# Patient Record
Sex: Male | Born: 1985 | Race: Black or African American | Hispanic: No | Marital: Single | Smoking: Current every day smoker
Health system: Southern US, Community
[De-identification: ages and names within clinical notes are randomized; demographics above are authoritative.]

---

## 2017-09-15 ENCOUNTER — Encounter (HOSPITAL_COMMUNITY): Payer: Self-pay

## 2017-09-15 ENCOUNTER — Emergency Department (HOSPITAL_COMMUNITY): Payer: Self-pay

## 2017-09-15 ENCOUNTER — Emergency Department (HOSPITAL_COMMUNITY)
Admission: EM | Admit: 2017-09-15 | Discharge: 2017-09-15 | Disposition: A | Discharge status: Home / Self Care | Payer: Self-pay | Attending: Emergency Medicine | Admitting: Emergency Medicine

## 2017-09-15 DIAGNOSIS — J069 Acute upper respiratory infection, unspecified: Secondary | ICD-10-CM | POA: Insufficient documentation

## 2017-09-15 DIAGNOSIS — R55 Syncope and collapse: Secondary | ICD-10-CM | POA: Insufficient documentation

## 2017-09-15 DIAGNOSIS — F17210 Nicotine dependence, cigarettes, uncomplicated: Secondary | ICD-10-CM | POA: Insufficient documentation

## 2017-09-15 DIAGNOSIS — E860 Dehydration: Secondary | ICD-10-CM | POA: Insufficient documentation

## 2017-09-15 LAB — CBC
HCT: 42.1 % (ref 39.0–52.0)
Hemoglobin: 13.6 g/dL (ref 13.0–17.0)
MCH: 28 pg (ref 26.0–34.0)
MCHC: 32.3 g/dL (ref 30.0–36.0)
MCV: 86.6 fL (ref 78.0–100.0)
PLATELETS: 267 10*3/uL (ref 150–400)
RBC: 4.86 MIL/uL (ref 4.22–5.81)
RDW: 13.2 % (ref 11.5–15.5)
WBC: 5.1 10*3/uL (ref 4.0–10.5)

## 2017-09-15 LAB — URINALYSIS, ROUTINE W REFLEX MICROSCOPIC
Bilirubin Urine: NEGATIVE
Glucose, UA: NEGATIVE mg/dL
HGB URINE DIPSTICK: NEGATIVE
Ketones, ur: 20 mg/dL — AB
LEUKOCYTES UA: NEGATIVE
NITRITE: NEGATIVE
PH: 5 (ref 5.0–8.0)
Protein, ur: 30 mg/dL — AB
SPECIFIC GRAVITY, URINE: 1.025 (ref 1.005–1.030)

## 2017-09-15 LAB — COMPREHENSIVE METABOLIC PANEL
ALT: 21 U/L (ref 17–63)
AST: 26 U/L (ref 15–41)
Albumin: 4 g/dL (ref 3.5–5.0)
Alkaline Phosphatase: 64 U/L (ref 38–126)
Anion gap: 12 (ref 5–15)
BILIRUBIN TOTAL: 1.3 mg/dL — AB (ref 0.3–1.2)
BUN: 17 mg/dL (ref 6–20)
CALCIUM: 9.1 mg/dL (ref 8.9–10.3)
CO2: 23 mmol/L (ref 22–32)
CREATININE: 1.44 mg/dL — AB (ref 0.61–1.24)
Chloride: 102 mmol/L (ref 101–111)
GFR calc Af Amer: >60 mL/min (ref >60)
Glucose, Bld: 133 mg/dL — ABNORMAL HIGH (ref 65–99)
Potassium: 4 mmol/L (ref 3.5–5.1)
Sodium: 137 mmol/L (ref 135–145)
Total Protein: 6.6 g/dL (ref 6.5–8.1)

## 2017-09-15 LAB — LIPASE, BLOOD: Lipase: 39 U/L (ref 11–51)

## 2017-09-15 MED ORDER — SODIUM CHLORIDE 0.9 % IV BOLUS (SEPSIS): 1000.0000 mL | Freq: Once | INTRAVENOUS | Status: DC

## 2017-09-15 NOTE — ED Provider Notes (Signed)
[facility] EMERGENCY DEPARTMENT Provider Note   CSN: [id number] Arrival date & time: 09/15/17  5621     History   Chief Complaint Chief Complaint  Patient presents with  . Nasal Congestion  . Emesis    HPI Aaron Barrett is a 32 y.o. male.  HPI  32 year old male presents with near syncope and cough times 1 week.  He was at work today standing up and started to feel lightheaded and like his vision was going dark.  He had to sit down and after 15 minutes or so it got better.  He states he has had cough with sneezing and congestion and yellow sputum for about 1 week.  No fevers.  This morning he vomited after eating his breakfast.  He has not eaten well over the last day or so.  Currently does not feel lightheaded but has had a mild headache.  He denies any focal weakness.  No chest pain or shortness of breath currently.  He denies any significant past medical history but states he has had near syncopal episode like this before.  He also states he has been dehydrated before requiring IV fluids.  History reviewed. No pertinent past medical history.  There are no active problems to display for this patient.   History reviewed. No pertinent surgical history.     Home Medications    Prior to Admission medications   Not on File    Family History No family history on file.  Social History Social History   Tobacco Use  . Smoking status: Current Every Day Smoker    Packs/day: 0.30    Types: Cigarettes  . Smokeless tobacco: Never Used  Substance Use Topics  . Alcohol use: Yes  . Drug use: Yes    Types: Marijuana     Allergies   Almond (diagnostic) and Citrus   Review of Systems Review of Systems  Constitutional: Negative for fever.  HENT: Positive for congestion, rhinorrhea and sneezing.   Eyes: Positive for visual disturbance.  Respiratory: Positive for cough. Negative for shortness of breath.   Cardiovascular: Negative for chest pain.    Gastrointestinal: Positive for vomiting. Negative for abdominal pain and nausea.  Neurological: Positive for light-headedness. Negative for syncope.  All other systems reviewed and are negative.    Physical Exam Updated Vital Signs BP 106/66   Pulse 62   Temp 98.2 F (36.8 C) (Oral)   Resp 16   Ht 6' (1.829 m)   Wt 72.6 kg (160 lb)   SpO2 97%   BMI 21.70 kg/m   Physical Exam  Constitutional: He is oriented to person, place, and time. He appears well-developed and well-nourished. No distress.  HENT:  Head: Normocephalic and atraumatic.  Right Ear: External ear normal.  Left Ear: External ear normal.  Nose: Nose normal.  Eyes: EOM are normal. Pupils are equal, round, and reactive to light. Right eye exhibits no discharge. Left eye exhibits no discharge.  Neck: Neck supple.  Cardiovascular: Normal rate, regular rhythm and normal heart sounds.  No murmur heard. Pulmonary/Chest: Effort normal and breath sounds normal. He has no wheezes. He has no rales.  Abdominal: Soft. There is no tenderness.  Musculoskeletal: He exhibits no edema.  Neurological: He is alert and oriented to person, place, and time.  CN 3-12 grossly intact. 5/5 strength in all 4 extremities. Grossly normal sensation. Normal finger to nose.   Skin: Skin is warm and dry. He is not diaphoretic.  Nursing note and  vitals reviewed.    ED Treatments / Results  Labs (all labs ordered are listed, but only abnormal results are displayed) Labs Reviewed  COMPREHENSIVE METABOLIC PANEL - Abnormal; Notable for the following components:      Result Value   Glucose, Bld 133 (*)    Creatinine, Ser 1.44 (*)    Total Bilirubin 1.3 (*)    All other components within normal limits  URINALYSIS, ROUTINE W REFLEX MICROSCOPIC - Abnormal; Notable for the following components:   Ketones, ur 20 (*)    Protein, ur 30 (*)    Bacteria, UA RARE (*)    Squamous Epithelial / LPF 0-5 (*)    All other components within normal limits   LIPASE, BLOOD  CBC    EKG  EKG Interpretation  Date/Time:  Friday September 15 2017 09:59:11 EST Ventricular Rate:  68 PR Interval:  134 QRS Duration: 86 QT Interval:  386 QTC Calculation: 410 R Axis:   128 Text Interpretation:  Normal sinus rhythm Left posterior fascicular block No old tracing to compare Confirmed by Pricilla LovelessGoldston, Lynnae Ludemann [phone]([id number]) on 09/15/2017 1:21:12 PM       Radiology Dg Chest 2 View  Result Date: 09/15/2017 CLINICAL DATA:  Chronic lightheadedness over the last 20 years. Worsened today. Vomiting and near syncope. EXAM: CHEST  2 VIEW COMPARISON:  None. FINDINGS: Heart size is normal. Mediastinal shadows are normal. Overlying snap artifacts. The lungs are clear. No bronchial thickening. No infiltrate, mass, effusion or collapse. Pulmonary vascularity is normal. No bony abnormality. IMPRESSION: Normal chest Electronically Signed   By: Paulina FusiMark  Shogry M.D.   On: 09/15/2017 14:24    Procedures Procedures (including critical care time)  Medications Ordered in ED Medications  sodium chloride 0.9 % bolus 1,000 mL (not administered)  sodium chloride 0.9 % bolus 1,000 mL (not administered)     Initial Impression / Assessment and Plan / ED Course  I have reviewed the triage vital signs and the nursing notes.  Pertinent labs & imaging results that were available during my care of the patient were reviewed by me and considered in my medical decision making (see chart for details).     Patient likely has dehydration associated with a viral URI.  There is no pneumonia on chest x-ray.  He is not significantly short of breath and has normal O2 saturations.  His vital signs show mild low blood pressure in the 90s.  Unclear what is normal for him but with some ketones in his urine and his symptoms I suspect he is dehydrated.  I discussed IV fluids but he declines and understands that he could get further dehydrated he is not able to keep up orally.  He does have a creatinine of 1.4  but unclear if this is new or chronic for him.  His BUN is normal.  I did discuss increasing fluids at home and other symptomatic care for URI.  Discussed needing to follow-up with a PCP given he appears to have on and off near syncopal episodes but may or may not be due to dehydration.  Return precautions.  Final Clinical Impressions(s) / ED Diagnoses   Final diagnoses:  Upper respiratory tract infection, unspecified type  Near syncope  Dehydration    ED Discharge Orders    None       Pricilla LovelessGoldston, Kariah Loredo, MD 09/15/17 1451

## 2017-09-15 NOTE — ED Notes (Signed)
Pt refused IV and fluids.  

## 2017-09-15 NOTE — ED Notes (Signed)
Patient transported to X-ray 

## 2017-09-15 NOTE — ED Triage Notes (Signed)
Per Pt, PT has had cough and congestion for one week. Reports having one episode of vomiting that started today with some lightheadedness and ringing in the ears. Pt noted to have low BP.

## 2018-08-22 ENCOUNTER — Encounter (HOSPITAL_COMMUNITY): Payer: Self-pay

## 2018-08-22 ENCOUNTER — Emergency Department (HOSPITAL_COMMUNITY)
Admission: EM | Admit: 2018-08-22 | Discharge: 2018-08-22 | Disposition: A | Discharge status: Home / Self Care | Payer: Self-pay | Attending: Emergency Medicine | Admitting: Emergency Medicine

## 2018-08-22 ENCOUNTER — Other Ambulatory Visit: Payer: Self-pay

## 2018-08-22 DIAGNOSIS — Z5321 Procedure and treatment not carried out due to patient leaving prior to being seen by health care provider: Secondary | ICD-10-CM | POA: Insufficient documentation

## 2018-08-22 DIAGNOSIS — R5383 Other fatigue: Secondary | ICD-10-CM | POA: Insufficient documentation

## 2018-08-22 NOTE — ED Notes (Signed)
Called for vital recheck with no answer. 
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
## 2018-10-11 ENCOUNTER — Emergency Department (HOSPITAL_COMMUNITY)
Admission: EM | Admit: 2018-10-11 | Discharge: 2018-10-11 | Disposition: A | Discharge status: Home / Self Care | Payer: Self-pay | Attending: Emergency Medicine | Admitting: Emergency Medicine

## 2018-10-11 ENCOUNTER — Encounter (HOSPITAL_COMMUNITY): Payer: Self-pay | Admitting: Emergency Medicine

## 2018-10-11 ENCOUNTER — Other Ambulatory Visit: Payer: Self-pay

## 2018-10-11 DIAGNOSIS — Z79899 Other long term (current) drug therapy: Secondary | ICD-10-CM | POA: Insufficient documentation

## 2018-10-11 DIAGNOSIS — F17210 Nicotine dependence, cigarettes, uncomplicated: Secondary | ICD-10-CM | POA: Insufficient documentation

## 2018-10-11 DIAGNOSIS — R112 Nausea with vomiting, unspecified: Secondary | ICD-10-CM | POA: Insufficient documentation

## 2018-10-11 LAB — CBC
HEMATOCRIT: 46.4 % (ref 39.0–52.0)
HEMOGLOBIN: 14.8 g/dL (ref 13.0–17.0)
MCH: 27.1 pg (ref 26.0–34.0)
MCHC: 31.9 g/dL (ref 30.0–36.0)
MCV: 84.8 fL (ref 80.0–100.0)
PLATELETS: 286 10*3/uL (ref 150–400)
RBC: 5.47 MIL/uL (ref 4.22–5.81)
RDW: 12.4 % (ref 11.5–15.5)
WBC: 11.8 10*3/uL — ABNORMAL HIGH (ref 4.0–10.5)
nRBC: 0 % (ref 0.0–0.2)

## 2018-10-11 LAB — COMPREHENSIVE METABOLIC PANEL
ALBUMIN: 4 g/dL (ref 3.5–5.0)
ALT: 25 U/L (ref 0–44)
AST: 22 U/L (ref 15–41)
Alkaline Phosphatase: 70 U/L (ref 38–126)
Anion gap: 19 — ABNORMAL HIGH (ref 5–15)
BILIRUBIN TOTAL: 1 mg/dL (ref 0.3–1.2)
BUN: 13 mg/dL (ref 6–20)
CO2: 21 mmol/L — ABNORMAL LOW (ref 22–32)
CREATININE: 1.27 mg/dL — AB (ref 0.61–1.24)
Calcium: 10.2 mg/dL (ref 8.9–10.3)
Chloride: 99 mmol/L (ref 98–111)
GFR calc Af Amer: >60 mL/min (ref >60)
GFR calc non Af Amer: >60 mL/min (ref >60)
GLUCOSE: 117 mg/dL — AB (ref 70–99)
POTASSIUM: 3.8 mmol/L (ref 3.5–5.1)
Sodium: 139 mmol/L (ref 135–145)
TOTAL PROTEIN: 7.2 g/dL (ref 6.5–8.1)

## 2018-10-11 LAB — RAPID URINE DRUG SCREEN, HOSP PERFORMED
Amphetamines: NOT DETECTED
Barbiturates: NOT DETECTED
Benzodiazepines: NOT DETECTED
Cocaine: NOT DETECTED
OPIATES: NOT DETECTED
Tetrahydrocannabinol: POSITIVE — AB

## 2018-10-11 LAB — URINALYSIS, ROUTINE W REFLEX MICROSCOPIC
BILIRUBIN URINE: NEGATIVE
Glucose, UA: NEGATIVE mg/dL
HGB URINE DIPSTICK: NEGATIVE
Ketones, ur: 20 mg/dL — AB
Leukocytes,Ua: NEGATIVE
Nitrite: NEGATIVE
PROTEIN: NEGATIVE mg/dL
Specific Gravity, Urine: 1.021 (ref 1.005–1.030)
pH: 8 (ref 5.0–8.0)

## 2018-10-11 LAB — LIPASE, BLOOD: Lipase: 42 U/L (ref 11–51)

## 2018-10-11 MED ORDER — LORAZEPAM 1 MG PO TABS
1.0000 mg | ORAL_TABLET | Freq: Once | ORAL | Status: AC
  Administered 2018-10-11: 1 mg via ORAL
  Filled 2018-10-11: qty 1

## 2018-10-11 MED ORDER — ONDANSETRON HCL 4 MG/2ML IJ SOLN
4.0000 mg | Freq: Once | INTRAMUSCULAR | Status: AC | PRN
  Administered 2018-10-11: 4 mg via INTRAVENOUS
  Filled 2018-10-11: qty 2

## 2018-10-11 MED ORDER — FAMOTIDINE IN NACL 20-0.9 MG/50ML-% IV SOLN
20.0000 mg | Freq: Once | INTRAVENOUS | Status: AC
  Administered 2018-10-11: 20 mg via INTRAVENOUS
  Filled 2018-10-11: qty 50

## 2018-10-11 MED ORDER — SODIUM CHLORIDE 0.9 % IV BOLUS
1000.0000 mL | Freq: Once | INTRAVENOUS | Status: AC
  Administered 2018-10-11: 1000 mL via INTRAVENOUS

## 2018-10-11 MED ORDER — SODIUM CHLORIDE 0.9% FLUSH
3.0000 mL | Freq: Once | INTRAVENOUS | Status: AC
  Administered 2018-10-11: 3 mL via INTRAVENOUS

## 2018-10-11 MED ORDER — ALUM & MAG HYDROXIDE-SIMETH 200-200-20 MG/5ML PO SUSP
30.0000 mL | Freq: Once | ORAL | Status: AC
  Administered 2018-10-11: 30 mL via ORAL
  Filled 2018-10-11: qty 30

## 2018-10-11 MED ORDER — SODIUM CHLORIDE 0.9 % IV BOLUS
500.0000 mL | Freq: Once | INTRAVENOUS | Status: AC
  Administered 2018-10-11: 500 mL via INTRAVENOUS

## 2018-10-11 MED ORDER — LORAZEPAM 1 MG PO TABS: 0.5000 mg | ORAL_TABLET | Freq: Once | ORAL | Status: DC

## 2018-10-11 MED ORDER — ONDANSETRON HCL 4 MG PO TABS: 4.0000 mg | ORAL_TABLET | Freq: Three times a day (TID) | ORAL | 0 refills | Status: AC | PRN

## 2018-10-11 NOTE — ED Triage Notes (Signed)
Pt reports sudden onset of N/V around 0300. Reports ~6 episodes of emesis. Denies blood in emesis, sick contacts. Denies diarrhea.

## 2018-10-11 NOTE — Discharge Instructions (Signed)

## 2018-10-11 NOTE — ED Provider Notes (Signed)
[facility] EMERGENCY DEPARTMENT Provider Note   CSN: [id number] Arrival date & time: 10/11/18  1638    History   Chief Complaint Chief Complaint  Patient presents with  . Nausea  . Emesis  . Weakness    HPI Aaron Barrett is a 33 y.o. male.    HPI   Patient is a 33 year old male with a history of anemia presents emergency department today for evaluation of nausea and vomiting that began around 3 AM this morning.  Patient reports he has had about 6 episodes of vomiting.  No hematemesis.  Reports epigastric abdominal pain that he rates at a 7/10.  States he was constipated today prior to the onset of the symptoms.  He does state his last bowel movement was yesterday.  Denies any diarrhea.  No fevers or urinary symptoms.  No sick contacts.  No history of abdominal surgeries.  Patient denies EtOH use.  He does endorse marijuana use.  Illicit substance use.  Denies taking other medications  History reviewed. No pertinent past medical history.  There are no active problems to display for this patient.   History reviewed. No pertinent surgical history.    Home Medications    Prior to Admission medications   Medication Sig Start Date End Date Taking? Authorizing Provider  ondansetron (ZOFRAN) 4 MG tablet Take 1 tablet (4 mg total) by mouth every 8 (eight) hours as needed for up to 6 doses for nausea or vomiting. 10/11/18   Kerrigan Glendening S, PA-C    Family History Family History  Problem Relation Age of Onset  . Diabetes Father     Social History Social History   Tobacco Use  . Smoking status: Current Every Day Smoker    Packs/day: 0.30    Types: Cigarettes  . Smokeless tobacco: Never Used  Substance Use Topics  . Alcohol use: Yes  . Drug use: Yes    Types: Marijuana    Comment: daily     Allergies   Almond (diagnostic) and Citrus   Review of Systems Review of Systems  Constitutional: Negative for fever.  HENT: Negative for ear pain and sore  throat.   Eyes: Negative for pain and visual disturbance.  Respiratory: Negative for cough and shortness of breath.   Cardiovascular: Negative for chest pain.  Gastrointestinal: Positive for abdominal pain, constipation, nausea and vomiting. Negative for blood in stool and diarrhea.  Genitourinary: Negative for dysuria and hematuria.  Musculoskeletal: Negative for back pain.  Skin: Negative for color change and rash.  Neurological: Negative for headaches.  All other systems reviewed and are negative.   Physical Exam Updated Vital Signs BP 108/68 (BP Location: Right Arm)   Pulse 90   Temp 98.2 F (36.8 C) (Oral)   Resp 16   SpO2 100%   Physical Exam Vitals signs and nursing note reviewed.  Constitutional:      Appearance: He is well-developed.  HENT:     Head: Normocephalic and atraumatic.     Mouth/Throat:     Mouth: Mucous membranes are moist.     Pharynx: No oropharyngeal exudate or posterior oropharyngeal erythema.  Eyes:     Conjunctiva/sclera: Conjunctivae normal.     Pupils: Pupils are equal, round, and reactive to light.  Neck:     Musculoskeletal: Neck supple.  Cardiovascular:     Rate and Rhythm: Regular rhythm. Tachycardia present.     Pulses: Normal pulses.     Heart sounds: Normal heart sounds. No murmur.  Comments: Marginally tachycardic on monitor with HR 100-102 Pulmonary:     Effort: Pulmonary effort is normal. No respiratory distress.     Breath sounds: Normal breath sounds. No wheezing or rhonchi.  Abdominal:     General: Bowel sounds are normal.     Palpations: Abdomen is soft.     Tenderness: There is abdominal tenderness (epigastric). There is no right CVA tenderness, left CVA tenderness, guarding or rebound.  Skin:    General: Skin is warm and dry.  Neurological:     Mental Status: He is alert.  Psychiatric:        Mood and Affect: Mood normal.    ED Treatments / Results  Labs (all labs ordered are listed, but only abnormal results are  displayed) Labs Reviewed  COMPREHENSIVE METABOLIC PANEL - Abnormal; Notable for the following components:      Result Value   CO2 21 (*)    Glucose, Bld 117 (*)    Creatinine, Ser 1.27 (*)    Anion gap 19 (*)    All other components within normal limits  CBC - Abnormal; Notable for the following components:   WBC 11.8 (*)    All other components within normal limits  URINALYSIS, ROUTINE W REFLEX MICROSCOPIC - Abnormal; Notable for the following components:   Ketones, ur 20 (*)    All other components within normal limits  RAPID URINE DRUG SCREEN, HOSP PERFORMED - Abnormal; Notable for the following components:   Tetrahydrocannabinol POSITIVE (*)    All other components within normal limits  LIPASE, BLOOD    EKG None  Radiology No results found.  Procedures Procedures (including critical care time)  Medications Ordered in ED Medications  sodium chloride flush (NS) 0.9 % injection 3 mL (3 mLs Intravenous Given 10/11/18 0701)  ondansetron (ZOFRAN) injection 4 mg (4 mg Intravenous Given 10/11/18 0706)  sodium chloride 0.9 % bolus 1,000 mL (0 mLs Intravenous Stopped 10/11/18 0800)  famotidine (PEPCID) IVPB 20 mg premix (0 mg Intravenous Stopped 10/11/18 0737)  alum & mag hydroxide-simeth (MAALOX/MYLANTA) 200-200-20 MG/5ML suspension 30 mL (30 mLs Oral Given 10/11/18 0801)  sodium chloride 0.9 % bolus 500 mL (0 mLs Intravenous Stopped 10/11/18 1019)  LORazepam (ATIVAN) tablet 1 mg (1 mg Oral Given 10/11/18 1000)     Initial Impression / Assessment and Plan / ED Course  I have reviewed the triage vital signs and the nursing notes.  Pertinent labs & imaging results that were available during my care of the patient were reviewed by me and considered in my medical decision making (see chart for details).      Final Clinical Impressions(s) / ED Diagnoses   Final diagnoses:  Non-intractable vomiting with nausea, unspecified vomiting type   Pt presenting with NV and epigastric abd pain  beginning this AM. Marginally tachycardic on arrival but otherwise has normal vitals.   No active vomiting on exam. Mild epigastric tenderness without guarding or rebound tenderness.   Will order labs, ua, uds. Will administer antiemetics, ivf, pepcid, gi cocktail and reassess.  CBC with slightly elevated WBC, no anemia CMP with normal electrolytes.  Normal kidney and liver function.  He does have slightly elevated anion gap and a slightly low CO2.  No elevated blood glucose. Lipase normal UA with ketonuria, consistent with recent history of multiple episodes of vomiting and likely somewhat hydrated. UDS positive for THC.  7:31 PM On reassessment pt is sleeping comfortable after medications. States his nausea and abd pain have improved. States  he is tired because he was up all night vomiting. IVF running. Still awaiting labs. Will reassess.   Sleeping comfortably in bed.  He has had no further episodes of vomiting.  His work-up is negative.  His abdomen has remained soft and nontender.  UDS positive for marijuana, question if his use of marijuana could be contributing to his symptoms.  Have discussed this with him.  We will have him follow-up with PCP and return the ER for new or worsening symptoms.  He voiced understanding the plan reasons to return.  Questions answered.  Patient will discharge.  ED Discharge Orders         Ordered    ondansetron (ZOFRAN) 4 MG tablet  Every 8 hours PRN     10/11/18 1107           Karrie Meres, PA-C 10/11/18 1217    Tegeler, Canary Brim, MD 10/11/18 [phone]

## 2020-01-17 IMAGING — CR DG CHEST 2V
2 series · 2 of 2 positions shown · non-contrast
Comparison: None.

CLINICAL DATA: Chronic lightheadedness over the last 20 years.
Worsened today. Vomiting and near syncope.

EXAM:
CHEST  2 VIEW

[chest pa]
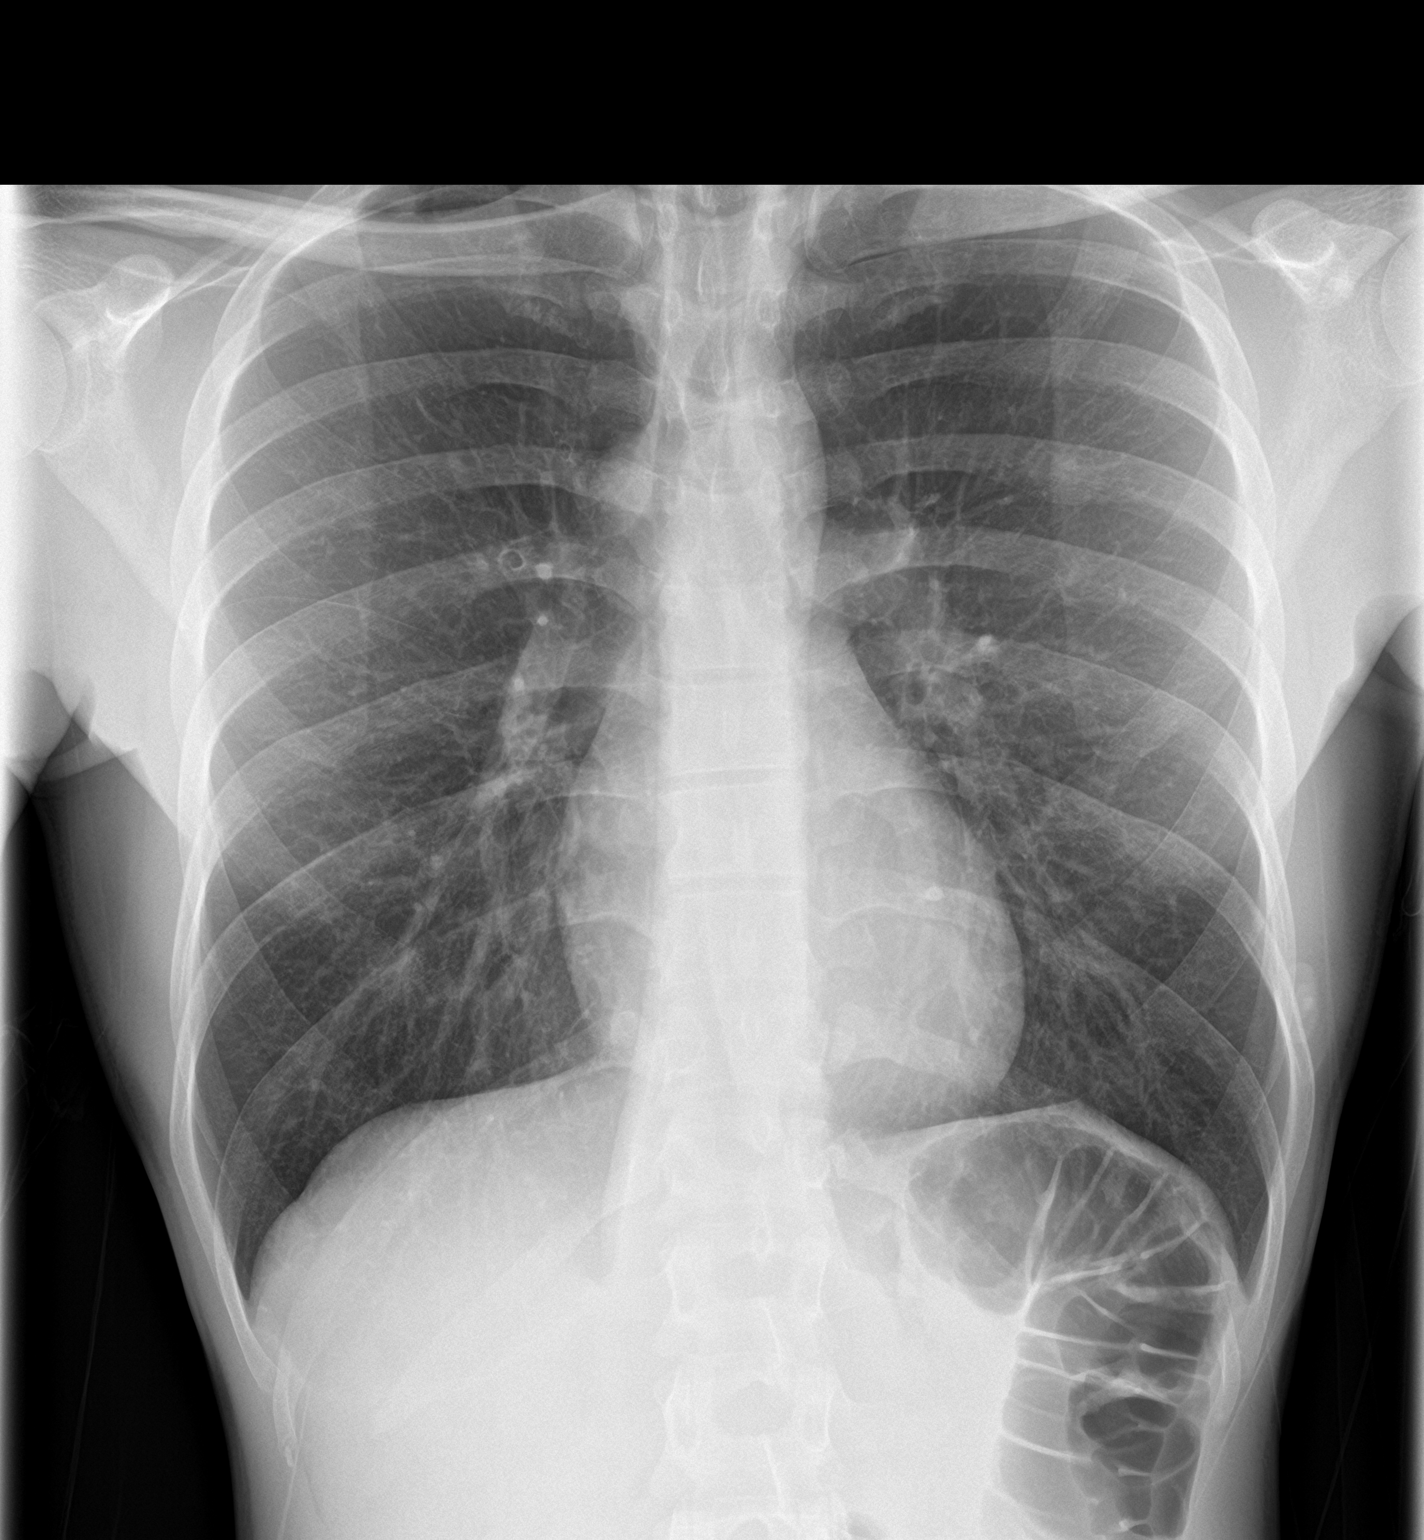

[chest lat]
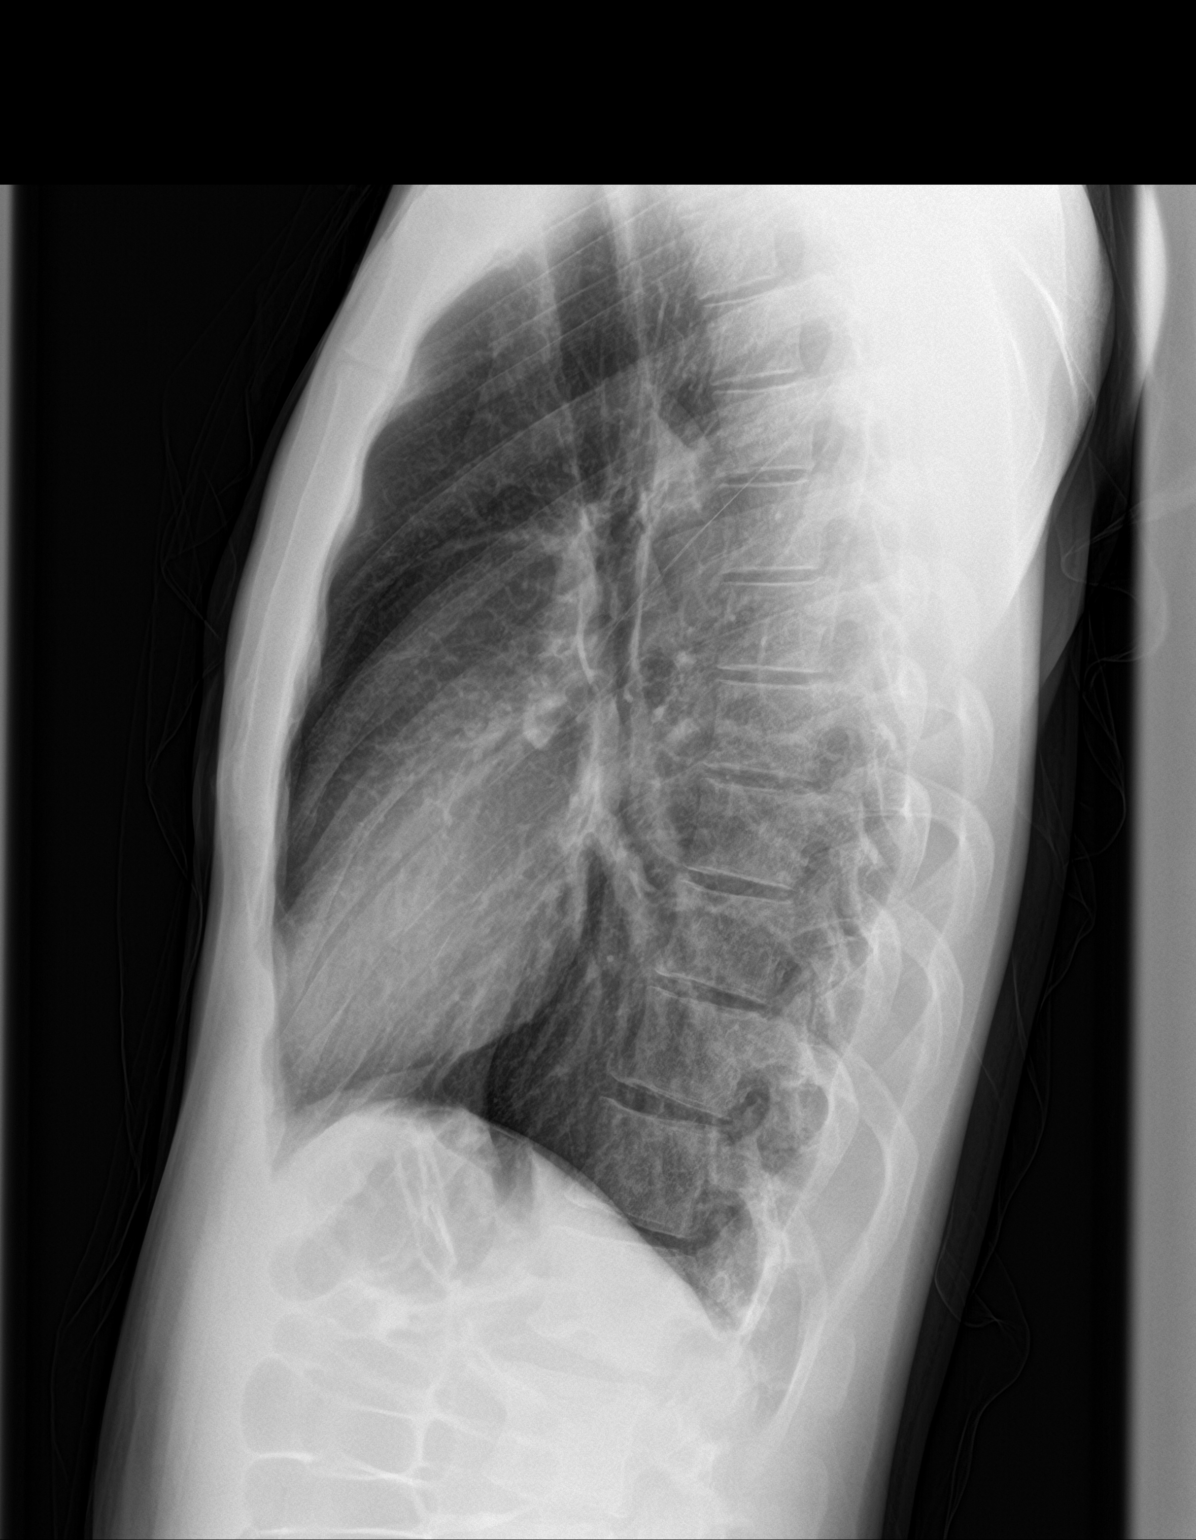

[2 of 2 positions shown; findings below may reference images not displayed]

FINDINGS: Heart size is normal. Mediastinal shadows are normal. Overlying snap
artifacts. The lungs are clear. No bronchial thickening. No
infiltrate, mass, effusion or collapse. Pulmonary vascularity is
normal. No bony abnormality.
IMPRESSION: Normal chest
